# Patient Record
Sex: Male | Born: 1980 | Race: White | Hispanic: Yes | Marital: Single
Health system: Southern US, Community
[De-identification: ages and names within clinical notes are randomized; demographics above are authoritative.]

## PROBLEM LIST (undated history)

## (undated) DIAGNOSIS — N289 Disorder of kidney and ureter, unspecified: Secondary | ICD-10-CM

## (undated) HISTORY — DX: Disorder of kidney and ureter, unspecified: N28.9

---

## 2005-06-06 ENCOUNTER — Emergency Department (HOSPITAL_COMMUNITY): Admission: EM | Admit: 2005-06-06 | Discharge: 2005-06-06 | Payer: Self-pay | Admitting: Emergency Medicine

## 2005-06-07 ENCOUNTER — Emergency Department (HOSPITAL_COMMUNITY): Admission: EM | Admit: 2005-06-07 | Discharge: 2005-06-07 | Payer: Self-pay | Admitting: Emergency Medicine

## 2006-08-20 ENCOUNTER — Emergency Department (HOSPITAL_COMMUNITY): Admission: EM | Admit: 2006-08-20 | Discharge: 2006-08-20 | Payer: Self-pay | Admitting: Emergency Medicine

## 2006-09-03 ENCOUNTER — Emergency Department (HOSPITAL_COMMUNITY): Admission: EM | Admit: 2006-09-03 | Discharge: 2006-09-03 | Payer: Self-pay | Admitting: Emergency Medicine

## 2016-02-10 ENCOUNTER — Emergency Department (HOSPITAL_COMMUNITY)
Admission: EM | Admit: 2016-02-10 | Discharge: 2016-02-10 | Disposition: A | Discharge status: Home / Self Care | Payer: Self-pay | Attending: Emergency Medicine | Admitting: Emergency Medicine

## 2016-02-10 ENCOUNTER — Encounter (HOSPITAL_COMMUNITY): Payer: Self-pay | Admitting: Emergency Medicine

## 2016-02-10 ENCOUNTER — Emergency Department (HOSPITAL_COMMUNITY): Payer: Self-pay

## 2016-02-10 DIAGNOSIS — R1032 Left lower quadrant pain: Secondary | ICD-10-CM

## 2016-02-10 DIAGNOSIS — F17200 Nicotine dependence, unspecified, uncomplicated: Secondary | ICD-10-CM | POA: Insufficient documentation

## 2016-02-10 DIAGNOSIS — Z791 Long term (current) use of non-steroidal anti-inflammatories (NSAID): Secondary | ICD-10-CM | POA: Insufficient documentation

## 2016-02-10 DIAGNOSIS — R739 Hyperglycemia, unspecified: Secondary | ICD-10-CM | POA: Insufficient documentation

## 2016-02-10 LAB — CBC WITH DIFFERENTIAL/PLATELET
Basophils Absolute: 0 10*3/uL (ref 0.0–0.1)
Basophils Relative: 0 %
Eosinophils Absolute: 0 10*3/uL (ref 0.0–0.7)
Eosinophils Relative: 0 %
HEMATOCRIT: 46.8 % (ref 39.0–52.0)
Hemoglobin: 15.8 g/dL (ref 13.0–17.0)
LYMPHS PCT: 14 %
Lymphs Abs: 1.2 10*3/uL (ref 0.7–4.0)
MCH: 30.3 pg (ref 26.0–34.0)
MCHC: 33.8 g/dL (ref 30.0–36.0)
MCV: 89.7 fL (ref 78.0–100.0)
MONO ABS: 0.3 10*3/uL (ref 0.1–1.0)
MONOS PCT: 4 %
NEUTROS ABS: 6.9 10*3/uL (ref 1.7–7.7)
Neutrophils Relative %: 82 %
Platelets: 194 10*3/uL (ref 150–400)
RBC: 5.22 MIL/uL (ref 4.22–5.81)
RDW: 12.6 % (ref 11.5–15.5)
WBC: 8.4 10*3/uL (ref 4.0–10.5)

## 2016-02-10 LAB — COMPREHENSIVE METABOLIC PANEL
ALT: 61 U/L (ref 17–63)
ANION GAP: 8 (ref 5–15)
AST: 33 U/L (ref 15–41)
Albumin: 3.9 g/dL (ref 3.5–5.0)
Alkaline Phosphatase: 91 U/L (ref 38–126)
BILIRUBIN TOTAL: 0.5 mg/dL (ref 0.3–1.2)
BUN: 15 mg/dL (ref 6–20)
CO2: 28 mmol/L (ref 22–32)
Calcium: 9 mg/dL (ref 8.9–10.3)
Chloride: 99 mmol/L — ABNORMAL LOW (ref 101–111)
Creatinine, Ser: 0.87 mg/dL (ref 0.61–1.24)
GFR calc Af Amer: >60 mL/min (ref >60)
Glucose, Bld: 159 mg/dL — ABNORMAL HIGH (ref 65–99)
POTASSIUM: 3.4 mmol/L — AB (ref 3.5–5.1)
Sodium: 135 mmol/L (ref 135–145)
TOTAL PROTEIN: 7.1 g/dL (ref 6.5–8.1)

## 2016-02-10 LAB — URINALYSIS, ROUTINE W REFLEX MICROSCOPIC
BILIRUBIN URINE: NEGATIVE
Glucose, UA: 50 mg/dL — AB
Hgb urine dipstick: NEGATIVE
KETONES UR: NEGATIVE mg/dL
LEUKOCYTES UA: NEGATIVE
NITRITE: NEGATIVE
PROTEIN: NEGATIVE mg/dL
Specific Gravity, Urine: 1.018 (ref 1.005–1.030)
pH: 8 (ref 5.0–8.0)

## 2016-02-10 LAB — LIPASE, BLOOD: LIPASE: 37 U/L (ref 11–51)

## 2016-02-10 MED ORDER — IOPAMIDOL (ISOVUE-300) INJECTION 61%
INTRAVENOUS | Status: AC
  Filled 2016-02-10: qty 30

## 2016-02-10 MED ORDER — MORPHINE SULFATE (PF) 4 MG/ML IV SOLN
4.0000 mg | Freq: Once | INTRAVENOUS | Status: AC
  Administered 2016-02-10: 4 mg via INTRAVENOUS
  Filled 2016-02-10: qty 1

## 2016-02-10 MED ORDER — ONDANSETRON HCL 4 MG/2ML IJ SOLN
4.0000 mg | Freq: Once | INTRAMUSCULAR | Status: AC
  Administered 2016-02-10: 4 mg via INTRAVENOUS
  Filled 2016-02-10: qty 2

## 2016-02-10 MED ORDER — OXYCODONE-ACETAMINOPHEN 5-325 MG PO TABS: 1.0000 | ORAL_TABLET | ORAL | 0 refills | Status: DC | PRN

## 2016-02-10 MED ORDER — SODIUM CHLORIDE 0.9 % IV BOLUS (SEPSIS)
1000.0000 mL | Freq: Once | INTRAVENOUS | Status: AC
  Administered 2016-02-10: 1000 mL via INTRAVENOUS

## 2016-02-10 MED ORDER — IOPAMIDOL (ISOVUE-300) INJECTION 61%
100.0000 mL | Freq: Once | INTRAVENOUS | Status: AC | PRN
  Administered 2016-02-10: 100 mL via INTRAVENOUS

## 2016-02-10 MED ORDER — PROMETHAZINE HCL 25 MG PO TABS: 25.0000 mg | ORAL_TABLET | Freq: Four times a day (QID) | ORAL | 0 refills | Status: DC | PRN

## 2016-02-10 NOTE — ED Triage Notes (Signed)
Right sided flank pain started yesterday. No urinary symptoms. No N/V. 10/10 pain. No fevers noted

## 2016-02-10 NOTE — ED Provider Notes (Signed)
[facility] Provider Note   CSN: [id number] Arrival date & time: 02/10/16  1141     History   Chief Complaint Chief Complaint  Patient presents with  . Abdominal Pain    HPI Jonathan Cobb is a 36 y.o. male.  Left lower quadrant pain for 24 hours. He is eating. No vomiting or diarrhea. No fever, sweats, chills, dysuria, hematuria, bloody stool.  No previous abdominal surgery. This pain is not typical for the patient. Severity is moderate. Nothing makes symptoms better or worse.      History reviewed. No pertinent past medical history.  There are no active problems to display for this patient.   History reviewed. No pertinent surgical history.     Home Medications    Prior to Admission medications   Medication Sig Start Date End Date Taking? Authorizing Provider  ibuprofen (ADVIL,MOTRIN) 200 MG tablet Take 400 mg by mouth every 6 (six) hours as needed for moderate pain.   Yes Historical Provider, MD  oxyCODONE-acetaminophen (PERCOCET) 5-325 MG tablet Take 1-2 tablets by mouth every 4 (four) hours as needed. 02/10/16   Donnetta HutchingBrian Willow Reczek, MD  promethazine (PHENERGAN) 25 MG tablet Take 1 tablet (25 mg total) by mouth every 6 (six) hours as needed. 02/10/16   Donnetta HutchingBrian Jossalin Chervenak, MD    Family History History reviewed. No pertinent family history.  Social History Social History  Substance Use Topics  . Smoking status: Current Some Day Smoker  . Smokeless tobacco: Never Used  . Alcohol use 3.0 oz/week    5 Standard drinks or equivalent per week     Allergies   Patient has no allergy information on record.   Review of Systems Review of Systems  All other systems reviewed and are negative.    Physical Exam Updated Vital Signs BP (!) 150/102   Pulse 74   Temp 98 F (36.7 C) (Oral)   Resp 18   Ht 5\' 5"  (1.651 m)   Wt 233 lb (105.7 kg)   SpO2 96%   BMI 38.77 kg/m   Physical Exam  Constitutional: He is oriented to person, place, and time. He appears well-developed  and well-nourished.  HENT:  Head: Normocephalic and atraumatic.  Eyes: Conjunctivae are normal.  Neck: Neck supple.  Cardiovascular: Normal rate and regular rhythm.   Pulmonary/Chest: Effort normal and breath sounds normal.  Abdominal:  Min LLQ tenderness  Musculoskeletal: Normal range of motion.  Neurological: He is alert and oriented to person, place, and time.  Skin: Skin is warm and dry.  Psychiatric: He has a normal mood and affect. His behavior is normal.  Nursing note and vitals reviewed.    ED Treatments / Results  Labs (all labs ordered are listed, but only abnormal results are displayed) Labs Reviewed  COMPREHENSIVE METABOLIC PANEL - Abnormal; Notable for the following:       Result Value   Potassium 3.4 (*)    Chloride 99 (*)    Glucose, Bld 159 (*)    All other components within normal limits  URINALYSIS, ROUTINE W REFLEX MICROSCOPIC - Abnormal; Notable for the following:    APPearance CLOUDY (*)    Glucose, UA 50 (*)    All other components within normal limits  CBC WITH DIFFERENTIAL/PLATELET  LIPASE, BLOOD    EKG  EKG Interpretation None       Radiology Ct Abdomen Pelvis W Contrast  Result Date: 02/10/2016 CLINICAL DATA:  Right-sided flank pain for 1 year. EXAM: CT ABDOMEN AND PELVIS WITH CONTRAST  TECHNIQUE: Multidetector CT imaging of the abdomen and pelvis was performed using the standard protocol following bolus administration of intravenous contrast. CONTRAST:  ISOVUE-300 IOPAMIDOL (ISOVUE-300) INJECTION 61% COMPARISON:  None. FINDINGS: Lower chest: Normal. Hepatobiliary: No focal liver abnormality is seen. No gallstones, gallbladder wall thickening, or biliary dilatation. Pancreas: Unremarkable. No pancreatic ductal dilatation or surrounding inflammatory changes. Spleen: Normal in size without focal abnormality. Adrenals/Urinary Tract: Adrenal glands are unremarkable. Kidneys are normal, without renal calculi, focal lesion, or hydronephrosis.  Bladder is unremarkable. Stomach/Bowel: Stomach is within normal limits. Appendix appears normal. No evidence of bowel wall thickening, distention, or inflammatory changes. Vascular/Lymphatic: No significant vascular findings are present. No enlarged abdominal or pelvic lymph nodes. Reproductive: Prostate is unremarkable. Other: No abdominal wall hernia or abnormality. No abdominopelvic ascites. Musculoskeletal: No acute or significant osseous findings. IMPRESSION: Benign-appearing abdomen and pelvis. Electronically Signed   By: Francene Boyers M.D.   On: 02/10/2016 14:58    Procedures Procedures (including critical care time)  Medications Ordered in ED Medications  iopamidol (ISOVUE-300) 61 % injection (not administered)  sodium chloride 0.9 % bolus 1,000 mL (0 mLs Intravenous Stopped 02/10/16 1508)  ondansetron (ZOFRAN) injection 4 mg (4 mg Intravenous Given 02/10/16 1329)  morphine 4 MG/ML injection 4 mg (4 mg Intravenous Given 02/10/16 1329)  morphine 4 MG/ML injection 4 mg (4 mg Intravenous Given 02/10/16 1507)  iopamidol (ISOVUE-300) 61 % injection 100 mL (100 mLs Intravenous Contrast Given 02/10/16 1438)     Initial Impression / Assessment and Plan / ED Course  I have reviewed the triage vital signs and the nursing notes.  Pertinent labs & imaging results that were available during my care of the patient were reviewed by me and considered in my medical decision making (see chart for details).  Clinical Course     No acute abdomen. I was concerned about possible diverticulitis versus kidney stone. CT abdomen pelvis shows no acute findings. His glucose is minimally elevated. Discharge medications Percocet and Phenergan 25 mg.  Discussed with patient and his wife. Will return to force  Final Clinical Impressions(s) / ED Diagnoses   Final diagnoses:  LLQ abdominal pain  Hyperglycemia    New Prescriptions New Prescriptions   OXYCODONE-ACETAMINOPHEN (PERCOCET) 5-325 MG TABLET    Take 1-2  tablets by mouth every 4 (four) hours as needed.   PROMETHAZINE (PHENERGAN) 25 MG TABLET    Take 1 tablet (25 mg total) by mouth every 6 (six) hours as needed.     Donnetta Hutching, MD 02/10/16 1525

## 2016-02-10 NOTE — Discharge Instructions (Signed)
CT scan of your abdomen did not show any serious medical issues. Your glucose or sugar was slightly elevated today. Medication for pain and nausea. Suggest clear liquids tonight. Return for fever, chills, trouble with urination, any concerns

## 2016-03-15 ENCOUNTER — Encounter (HOSPITAL_COMMUNITY): Payer: Self-pay | Admitting: Emergency Medicine

## 2016-03-15 ENCOUNTER — Emergency Department (HOSPITAL_COMMUNITY)
Admission: EM | Admit: 2016-03-15 | Discharge: 2016-03-15 | Disposition: A | Discharge status: Home / Self Care | Payer: Self-pay | Attending: Emergency Medicine | Admitting: Emergency Medicine

## 2016-03-15 ENCOUNTER — Emergency Department (HOSPITAL_COMMUNITY): Payer: Self-pay

## 2016-03-15 DIAGNOSIS — R0781 Pleurodynia: Secondary | ICD-10-CM

## 2016-03-15 DIAGNOSIS — F17200 Nicotine dependence, unspecified, uncomplicated: Secondary | ICD-10-CM | POA: Insufficient documentation

## 2016-03-15 DIAGNOSIS — J40 Bronchitis, not specified as acute or chronic: Secondary | ICD-10-CM | POA: Insufficient documentation

## 2016-03-15 LAB — COMPREHENSIVE METABOLIC PANEL
ALT: 62 U/L (ref 17–63)
AST: 31 U/L (ref 15–41)
Albumin: 4 g/dL (ref 3.5–5.0)
Alkaline Phosphatase: 83 U/L (ref 38–126)
Anion gap: 11 (ref 5–15)
BILIRUBIN TOTAL: 0.6 mg/dL (ref 0.3–1.2)
BUN: 14 mg/dL (ref 6–20)
CO2: 25 mmol/L (ref 22–32)
CREATININE: 0.71 mg/dL (ref 0.61–1.24)
Calcium: 9.3 mg/dL (ref 8.9–10.3)
Chloride: 102 mmol/L (ref 101–111)
Glucose, Bld: 107 mg/dL — ABNORMAL HIGH (ref 65–99)
POTASSIUM: 3.5 mmol/L (ref 3.5–5.1)
Sodium: 138 mmol/L (ref 135–145)
TOTAL PROTEIN: 7.2 g/dL (ref 6.5–8.1)

## 2016-03-15 LAB — URINALYSIS, ROUTINE W REFLEX MICROSCOPIC
Bilirubin Urine: NEGATIVE
Glucose, UA: NEGATIVE mg/dL
KETONES UR: NEGATIVE mg/dL
LEUKOCYTES UA: NEGATIVE
Nitrite: NEGATIVE
Specific Gravity, Urine: 1.02 (ref 1.005–1.030)
pH: 6 (ref 5.0–8.0)

## 2016-03-15 LAB — CBC WITH DIFFERENTIAL/PLATELET
BASOS ABS: 0 10*3/uL (ref 0.0–0.1)
Basophils Relative: 0 %
EOS ABS: 0.1 10*3/uL (ref 0.0–0.7)
EOS PCT: 1 %
HCT: 45.5 % (ref 39.0–52.0)
HEMOGLOBIN: 16 g/dL (ref 13.0–17.0)
LYMPHS ABS: 2.4 10*3/uL (ref 0.7–4.0)
Lymphocytes Relative: 26 %
MCH: 30.3 pg (ref 26.0–34.0)
MCHC: 35.2 g/dL (ref 30.0–36.0)
MCV: 86.2 fL (ref 78.0–100.0)
Monocytes Absolute: 0.6 10*3/uL (ref 0.1–1.0)
Monocytes Relative: 7 %
NEUTROS PCT: 66 %
Neutro Abs: 5.9 10*3/uL (ref 1.7–7.7)
PLATELETS: 192 10*3/uL (ref 150–400)
RBC: 5.28 MIL/uL (ref 4.22–5.81)
RDW: 12.3 % (ref 11.5–15.5)
WBC: 9 10*3/uL (ref 4.0–10.5)

## 2016-03-15 LAB — URINALYSIS, MICROSCOPIC (REFLEX)
BACTERIA UA: NONE SEEN
WBC, UA: NONE SEEN WBC/hpf (ref 0–5)

## 2016-03-15 MED ORDER — AZITHROMYCIN 250 MG PO TABS: ORAL_TABLET | ORAL | 0 refills | Status: DC

## 2016-03-15 MED ORDER — NAPROXEN 500 MG PO TABS: 500.0000 mg | ORAL_TABLET | Freq: Two times a day (BID) | ORAL | 0 refills | Status: DC

## 2016-03-15 MED ORDER — OXYCODONE-ACETAMINOPHEN 5-325 MG PO TABS
1.0000 | ORAL_TABLET | Freq: Once | ORAL | Status: AC
  Administered 2016-03-15: 1 via ORAL
  Filled 2016-03-15: qty 1

## 2016-03-15 NOTE — ED Triage Notes (Signed)
Patient complains of left flank pain that started yesterday. Patient denies N/V/D, or urinary symptoms.

## 2016-03-15 NOTE — ED Provider Notes (Signed)
[facility] Provider Note   CSN: [id number] Arrival date & time: 03/15/16  1219     History   Chief Complaint Chief Complaint  Patient presents with  . Flank Pain    HPI Jonathan Cobb is a 36 y.o. male.  HPI   Jonathan Cobb is a 36 y.o. male who presents to the Emergency Department complaining of continued left abdominal pain.  He states he was seen here last month for left abdominal pain.  Pain temporarily improved after taking prescription pain medication.  Pain became worse yesterday. He describes a pain to his lower left ribs.  Pain is reproduced with palpation.  Pain is not associated with food intake, fever, chills, shortness of breath, vomiting, or diarrhea.  He also denies known injury.      History reviewed. No pertinent past medical history.  There are no active problems to display for this patient.   History reviewed. No pertinent surgical history.     Home Medications    Prior to Admission medications   Medication Sig Start Date End Date Taking? Authorizing Provider  ibuprofen (ADVIL,MOTRIN) 200 MG tablet Take 400 mg by mouth every 6 (six) hours as needed for moderate pain.    Historical Provider, MD  oxyCODONE-acetaminophen (PERCOCET) 5-325 MG tablet Take 1-2 tablets by mouth every 4 (four) hours as needed. 02/10/16   Donnetta Hutching, MD  promethazine (PHENERGAN) 25 MG tablet Take 1 tablet (25 mg total) by mouth every 6 (six) hours as needed. 02/10/16   Donnetta Hutching, MD    Family History No family history on file.  Social History Social History  Substance Use Topics  . Smoking status: Current Some Day Smoker  . Smokeless tobacco: Never Used  . Alcohol use 3.0 oz/week    5 Standard drinks or equivalent per week     Allergies   Patient has no known allergies.   Review of Systems Review of Systems  Constitutional: Negative for activity change, appetite change and fever.  HENT: Negative for congestion and trouble swallowing.   Respiratory:  Negative for cough and shortness of breath.   Cardiovascular: Positive for chest pain (left lower rib pain).  Gastrointestinal: Negative for abdominal pain, diarrhea, nausea and vomiting.  Genitourinary: Negative for dysuria, flank pain, hematuria, penile swelling and scrotal swelling.  Musculoskeletal: Negative for back pain.  Skin: Negative for rash.  Neurological: Negative for dizziness, weakness, numbness and headaches.     Physical Exam Updated Vital Signs BP 153/99 (BP Location: Left Arm)   Pulse 86   Temp 97.8 F (36.6 C) (Oral)   Ht 5\' 4"  (1.626 m)   Wt 103.9 kg   SpO2 98%   BMI 39.31 kg/m   Physical Exam  Constitutional: He is oriented to person, place, and time. He appears well-developed and well-nourished. No distress.  HENT:  Head: Atraumatic.  Mouth/Throat: Oropharynx is clear and moist.  Neck: Normal range of motion. Neck supple. No JVD present.  Cardiovascular: Normal rate, regular rhythm, normal heart sounds and intact distal pulses.   No murmur heard. Pulmonary/Chest: Effort normal. He exhibits tenderness (focal tenderness of the left lower ribs,.  no crepitus no guarding on exam).  Abdominal: Soft. Normal appearance. He exhibits no distension and no mass. There is no tenderness. There is no rebound, no guarding and no CVA tenderness.  Musculoskeletal: Normal range of motion. He exhibits no edema or tenderness.  Neurological: He is oriented to person, place, and time. No cranial nerve deficit.  Skin: Skin is  warm. No rash noted.  Nursing note and vitals reviewed.    ED Treatments / Results  Labs (all labs ordered are listed, but only abnormal results are displayed) Labs Reviewed  URINALYSIS, ROUTINE W REFLEX MICROSCOPIC - Abnormal; Notable for the following:       Result Value   Hgb urine dipstick TRACE (*)    Protein, ur TRACE (*)    All other components within normal limits  URINALYSIS, MICROSCOPIC (REFLEX) - Abnormal; Notable for the following:     Squamous Epithelial / LPF 0-5 (*)    All other components within normal limits  COMPREHENSIVE METABOLIC PANEL  CBC WITH DIFFERENTIAL/PLATELET    EKG  EKG Interpretation None       Radiology Dg Ribs Unilateral W/chest Left  Result Date: 03/15/2016 CLINICAL DATA:  LEFT rib pain beginning this morning, no known injury, smoker EXAM: LEFT RIBS AND CHEST - 3+ VIEW COMPARISON:  None. FINDINGS: Upper normal heart size. Mediastinal contours and pulmonary vascularity normal. Bronchitic changes without infiltrate, pleural effusion or pneumothorax. Osseous mineralization normal. No rib fracture or bone destruction. IMPRESSION: Bronchitic changes. No acute LEFT rib abnormalities. Electronically Signed   By: Ulyses SouthwardMark  Boles M.D.   On: 03/15/2016 17:50    Procedures Procedures (including critical care time)  Medications Ordered in ED Medications  oxyCODONE-acetaminophen (PERCOCET/ROXICET) 5-325 MG per tablet 1 tablet (not administered)     Initial Impression / Assessment and Plan / ED Course  I have reviewed the triage vital signs and the nursing notes.  Pertinent labs & imaging results that were available during my care of the patient were reviewed by me and considered in my medical decision making (see chart for details).     Pt well appearing.  Vitals stable.  Doubt acute abdomen or PE.   Pt seen here last month for same.  Labs, XR are reassuring.  XR shows bronchitis. CT A/P from previous visit reviewed.    Pt also seen by Dr. Adriana Simasook and care plan discussed.   The patient appears reasonably screened and/or stabilized for discharge and I doubt any other medical condition or other [facility] requiring further screening, evaluation, or treatment in the ED at this time prior to discharge.   Final Clinical Impressions(s) / ED Diagnoses   Final diagnoses:  Rib pain on left side  Bronchitis    New Prescriptions New Prescriptions   No medications on file     Pauline Ausammy Shelbey Spindler, PA-C 03/17/16  1323    Donnetta HutchingBrian Cook, MD 03/20/16 1313

## 2016-03-15 NOTE — Discharge Instructions (Signed)
You can contact one of the providers listed to establish primary care if needed.

## 2018-05-19 ENCOUNTER — Emergency Department (HOSPITAL_COMMUNITY)
Admission: EM | Admit: 2018-05-19 | Discharge: 2018-05-19 | Disposition: A | Discharge status: Home / Self Care | Payer: No Typology Code available for payment source | Attending: Emergency Medicine | Admitting: Emergency Medicine

## 2018-05-19 ENCOUNTER — Emergency Department (HOSPITAL_COMMUNITY): Payer: No Typology Code available for payment source

## 2018-05-19 ENCOUNTER — Encounter (HOSPITAL_COMMUNITY): Payer: Self-pay | Admitting: Adult Health

## 2018-05-19 ENCOUNTER — Other Ambulatory Visit: Payer: Self-pay

## 2018-05-19 DIAGNOSIS — Z79899 Other long term (current) drug therapy: Secondary | ICD-10-CM | POA: Diagnosis not present

## 2018-05-19 DIAGNOSIS — F17200 Nicotine dependence, unspecified, uncomplicated: Secondary | ICD-10-CM | POA: Insufficient documentation

## 2018-05-19 DIAGNOSIS — Y929 Unspecified place or not applicable: Secondary | ICD-10-CM | POA: Insufficient documentation

## 2018-05-19 DIAGNOSIS — S80819A Abrasion, unspecified lower leg, initial encounter: Secondary | ICD-10-CM | POA: Insufficient documentation

## 2018-05-19 DIAGNOSIS — S0990XA Unspecified injury of head, initial encounter: Secondary | ICD-10-CM | POA: Diagnosis present

## 2018-05-19 DIAGNOSIS — Y939 Activity, unspecified: Secondary | ICD-10-CM | POA: Diagnosis not present

## 2018-05-19 DIAGNOSIS — T07XXXA Unspecified multiple injuries, initial encounter: Secondary | ICD-10-CM

## 2018-05-19 DIAGNOSIS — S7002XA Contusion of left hip, initial encounter: Secondary | ICD-10-CM | POA: Diagnosis not present

## 2018-05-19 DIAGNOSIS — S20212A Contusion of left front wall of thorax, initial encounter: Secondary | ICD-10-CM | POA: Diagnosis not present

## 2018-05-19 DIAGNOSIS — Y999 Unspecified external cause status: Secondary | ICD-10-CM | POA: Insufficient documentation

## 2018-05-19 DIAGNOSIS — Z23 Encounter for immunization: Secondary | ICD-10-CM | POA: Insufficient documentation

## 2018-05-19 DIAGNOSIS — R1032 Left lower quadrant pain: Secondary | ICD-10-CM | POA: Diagnosis not present

## 2018-05-19 LAB — ETHANOL: Alcohol, Ethyl (B): <10 mg/dL (ref <10)

## 2018-05-19 LAB — CBC
HCT: 48.9 % (ref 39.0–52.0)
Hemoglobin: 16.1 g/dL (ref 13.0–17.0)
MCH: 29 pg (ref 26.0–34.0)
MCHC: 32.9 g/dL (ref 30.0–36.0)
MCV: 88.1 fL (ref 80.0–100.0)
Platelets: 181 10*3/uL (ref 150–400)
RBC: 5.55 MIL/uL (ref 4.22–5.81)
RDW: 12.4 % (ref 11.5–15.5)
WBC: 8.3 10*3/uL (ref 4.0–10.5)
nRBC: 0 % (ref 0.0–0.2)

## 2018-05-19 LAB — PROTIME-INR
INR: 1 (ref 0.8–1.2)
Prothrombin Time: 12.8 seconds (ref 11.4–15.2)

## 2018-05-19 LAB — CDS SEROLOGY

## 2018-05-19 LAB — COMPREHENSIVE METABOLIC PANEL
ALT: 70 U/L — ABNORMAL HIGH (ref 0–44)
AST: 42 U/L — ABNORMAL HIGH (ref 15–41)
Albumin: 4 g/dL (ref 3.5–5.0)
Alkaline Phosphatase: 102 U/L (ref 38–126)
Anion gap: 10 (ref 5–15)
BUN: 20 mg/dL (ref 6–20)
CO2: 26 mmol/L (ref 22–32)
Calcium: 8.9 mg/dL (ref 8.9–10.3)
Chloride: 104 mmol/L (ref 98–111)
Creatinine, Ser: 0.87 mg/dL (ref 0.61–1.24)
GFR calc Af Amer: >60 mL/min (ref >60)
GFR calc non Af Amer: >60 mL/min (ref >60)
Glucose, Bld: 113 mg/dL — ABNORMAL HIGH (ref 70–99)
Potassium: 3.5 mmol/L (ref 3.5–5.1)
Sodium: 140 mmol/L (ref 135–145)
Total Bilirubin: 0.8 mg/dL (ref 0.3–1.2)
Total Protein: 7.4 g/dL (ref 6.5–8.1)

## 2018-05-19 LAB — URINALYSIS, ROUTINE W REFLEX MICROSCOPIC
Bacteria, UA: NONE SEEN
Bilirubin Urine: NEGATIVE
Glucose, UA: NEGATIVE mg/dL
Ketones, ur: NEGATIVE mg/dL
Leukocytes,Ua: NEGATIVE
Nitrite: NEGATIVE
Protein, ur: NEGATIVE mg/dL
RBC / HPF: >50 RBC/hpf — ABNORMAL HIGH (ref 0–5)
Specific Gravity, Urine: 1.045 — ABNORMAL HIGH (ref 1.005–1.030)
pH: 6 (ref 5.0–8.0)

## 2018-05-19 LAB — SAMPLE TO BLOOD BANK

## 2018-05-19 LAB — LACTIC ACID, PLASMA: Lactic Acid, Venous: 1.8 mmol/L (ref 0.5–1.9)

## 2018-05-19 MED ORDER — SODIUM CHLORIDE 0.9 % IV BOLUS
1000.0000 mL | Freq: Once | INTRAVENOUS | Status: AC
  Administered 2018-05-19: 1000 mL via INTRAVENOUS

## 2018-05-19 MED ORDER — IBUPROFEN 800 MG PO TABS: 800.0000 mg | ORAL_TABLET | Freq: Three times a day (TID) | ORAL | 0 refills | Status: DC | PRN

## 2018-05-19 MED ORDER — MORPHINE SULFATE (PF) 4 MG/ML IV SOLN
4.0000 mg | Freq: Once | INTRAVENOUS | Status: AC
  Administered 2018-05-19: 4 mg via INTRAVENOUS
  Filled 2018-05-19: qty 1

## 2018-05-19 MED ORDER — OXYCODONE-ACETAMINOPHEN 5-325 MG PO TABS: 1.0000 | ORAL_TABLET | Freq: Four times a day (QID) | ORAL | 0 refills | Status: AC | PRN

## 2018-05-19 MED ORDER — IOHEXOL 300 MG/ML  SOLN
100.0000 mL | Freq: Once | INTRAMUSCULAR | Status: AC | PRN
  Administered 2018-05-19: 100 mL via INTRAVENOUS

## 2018-05-19 MED ORDER — TETANUS-DIPHTH-ACELL PERTUSSIS 5-2.5-18.5 LF-MCG/0.5 IM SUSP
0.5000 mL | Freq: Once | INTRAMUSCULAR | Status: AC
  Administered 2018-05-19: 0.5 mL via INTRAMUSCULAR
  Filled 2018-05-19: qty 0.5

## 2018-05-19 MED ORDER — OXYCODONE-ACETAMINOPHEN 5-325 MG PO TABS
1.0000 | ORAL_TABLET | Freq: Once | ORAL | Status: AC
  Administered 2018-05-19: 1 via ORAL
  Filled 2018-05-19: qty 1

## 2018-05-19 MED ORDER — ONDANSETRON HCL 4 MG/2ML IJ SOLN
4.0000 mg | Freq: Once | INTRAMUSCULAR | Status: AC
  Administered 2018-05-19: 4 mg via INTRAVENOUS
  Filled 2018-05-19: qty 2
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
## 2018-05-19 NOTE — ED Notes (Signed)
PT GIVEN URINAL

## 2018-05-19 NOTE — ED Notes (Signed)
Pt given water, sitting up
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
## 2018-05-19 NOTE — ED Notes (Signed)
Pt transported to radiology.

## 2018-05-19 NOTE — ED Notes (Signed)
Pt ambulated in room, stand by assistance, pt tolerated well, steady gait, states pain in left hip and right knee
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
## 2018-05-19 NOTE — ED Notes (Signed)
X-ray at bedside

## 2018-05-19 NOTE — ED Notes (Signed)
Pt arrived back from radiology. Blood noted on pt's gown. Pt bleeding from lac to LT hip. Pressure applied by this RN. EDP notified and is at bedside. Pressure dressing applied.

## 2018-05-31 ENCOUNTER — Telehealth: Payer: Self-pay | Admitting: Orthopedic Surgery
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
## 2018-06-01 NOTE — Telephone Encounter (Signed)
Yes this is a wait till June situation

## 2018-06-01 NOTE — Telephone Encounter (Signed)
Called Jonathan Cobb this morning and relayed the message. He stated they may look somewhere else.

## 2018-06-14 ENCOUNTER — Other Ambulatory Visit: Payer: Self-pay

## 2018-06-14 ENCOUNTER — Ambulatory Visit: Payer: Self-pay | Admitting: Orthopaedic Surgery

## 2018-06-21 ENCOUNTER — Encounter: Payer: Self-pay | Admitting: Orthopaedic Surgery

## 2018-06-21 ENCOUNTER — Ambulatory Visit (INDEPENDENT_AMBULATORY_CARE_PROVIDER_SITE_OTHER): Payer: Self-pay | Admitting: Orthopaedic Surgery

## 2018-06-21 ENCOUNTER — Ambulatory Visit: Payer: Self-pay

## 2018-06-21 ENCOUNTER — Ambulatory Visit (INDEPENDENT_AMBULATORY_CARE_PROVIDER_SITE_OTHER): Payer: Self-pay

## 2018-06-21 ENCOUNTER — Other Ambulatory Visit: Payer: Self-pay

## 2018-06-21 VITALS — BP 140/92 | HR 70 | Ht 67.0 in | Wt 243.0 lb

## 2018-06-21 DIAGNOSIS — M25532 Pain in left wrist: Secondary | ICD-10-CM

## 2018-06-21 DIAGNOSIS — M25552 Pain in left hip: Secondary | ICD-10-CM

## 2018-06-21 DIAGNOSIS — S7002XA Contusion of left hip, initial encounter: Secondary | ICD-10-CM | POA: Insufficient documentation

## 2018-06-21 MED ORDER — IBUPROFEN 800 MG PO TABS: 800.0000 mg | ORAL_TABLET | Freq: Two times a day (BID) | ORAL | 1 refills | Status: AC | PRN
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
## 2018-07-11 ENCOUNTER — Ambulatory Visit (HOSPITAL_COMMUNITY): Payer: No Typology Code available for payment source | Admitting: Physical Therapy

## 2018-07-11 ENCOUNTER — Other Ambulatory Visit: Payer: Self-pay

## 2018-07-11 ENCOUNTER — Telehealth (HOSPITAL_COMMUNITY): Payer: Self-pay

## 2018-07-11 ENCOUNTER — Ambulatory Visit (HOSPITAL_COMMUNITY): Payer: No Typology Code available for payment source | Attending: Orthopaedic Surgery

## 2018-07-11 ENCOUNTER — Encounter (HOSPITAL_COMMUNITY): Payer: Self-pay | Admitting: Physical Therapy

## 2018-07-11 ENCOUNTER — Encounter (HOSPITAL_COMMUNITY): Payer: Self-pay

## 2018-07-11 DIAGNOSIS — R29898 Other symptoms and signs involving the musculoskeletal system: Secondary | ICD-10-CM | POA: Diagnosis present

## 2018-07-11 DIAGNOSIS — M6281 Muscle weakness (generalized): Secondary | ICD-10-CM

## 2018-07-11 DIAGNOSIS — M25532 Pain in left wrist: Secondary | ICD-10-CM | POA: Diagnosis present

## 2018-07-11 DIAGNOSIS — M79652 Pain in left thigh: Secondary | ICD-10-CM
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
## 2018-07-11 NOTE — Patient Instructions (Addendum)
  Access Code: [facility]  URL: [email or web address]  Date: 07/11/2018  Prepared by: Verne Carrow   Exercises Supine Bridge - 10 reps - 2 sets - 2 hold - 1x daily - 7x weekly Seated Hip Adduction Isometrics with Ball - 10 reps - 2 sets - 10 seconds hold - 1x daily - 7x weekly Hooklying Isometric Hip Flexion - 10 reps - 2 sets - 10 hold - 1x daily - 7x weekly
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
## 2018-07-11 NOTE — Patient Instructions (Signed)
Hold: 20-30 seconds. Complete: 2   Hold: 20-30 seconds. Complete: 2       Complete: 10-15X      Complete: 10-15X

## 2018-07-19 ENCOUNTER — Other Ambulatory Visit: Payer: Self-pay

## 2018-07-19 ENCOUNTER — Ambulatory Visit: Payer: Self-pay | Admitting: Orthopaedic Surgery

## 2018-08-16 ENCOUNTER — Telehealth (HOSPITAL_COMMUNITY): Payer: Self-pay | Admitting: Physical Therapy

## 2018-08-16 NOTE — Telephone Encounter (Signed)
This pt's attorney had faxed over a medical records release form requesting medical records from our office The paper work for medical records has been faxed over to the him dept.

## 2018-11-08 IMAGING — CT CT ABD-PELV W/ CM
2 of 4 series · 17 of 46 positions shown, 19 images · IV contrast (Isovue)
Comparison: None.

CLINICAL DATA: Right-sided flank pain for 1 year.

EXAM:
CT ABDOMEN AND PELVIS WITH CONTRAST
TECHNIQUE: Multidetector CT imaging of the abdomen and pelvis was performed
using the standard protocol following bolus administration of
intravenous contrast.
CONTRAST:  100mL [id number] IOPAMIDOL ([id number]) INJECTION 61%

[Series 2: axial st · axial · 0.79mm/px · z∈[+1023,+1478]mm · 14 of 101 slices shown, 16 images]
[im 5/101  soft-tissue]
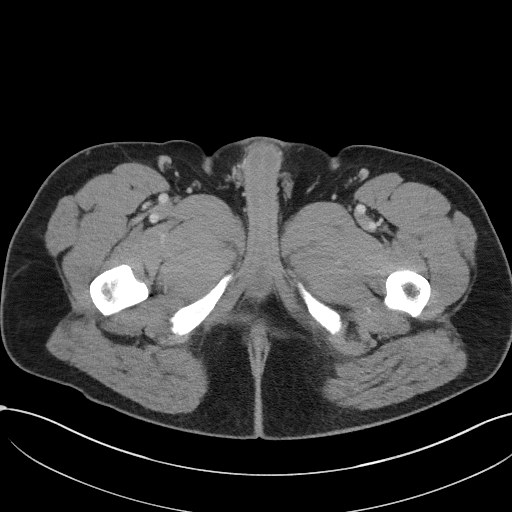
[im 5/101  bone]
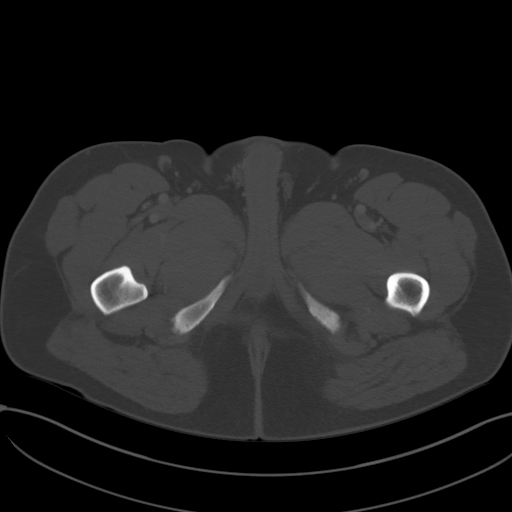
[im 15/101  soft-tissue]
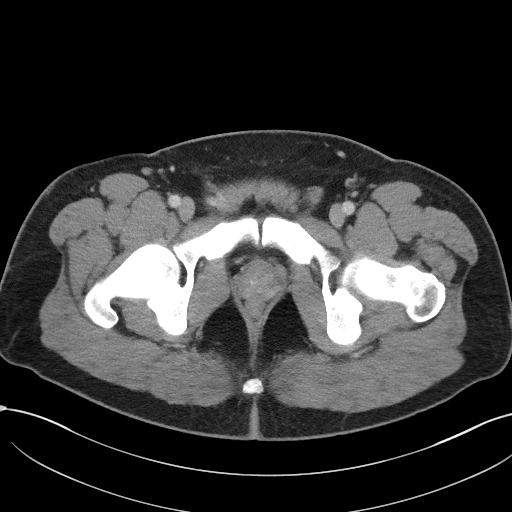
[im 20/101  soft-tissue]
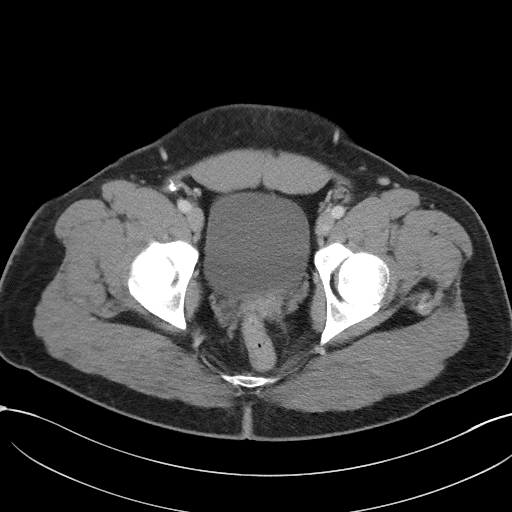
[im 29/101  soft-tissue]
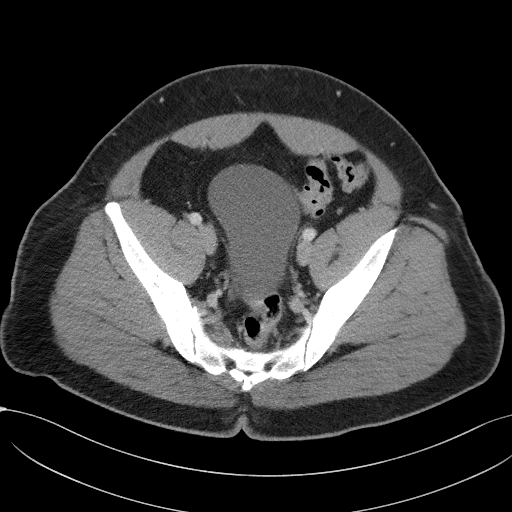
[im 34/101  soft-tissue]
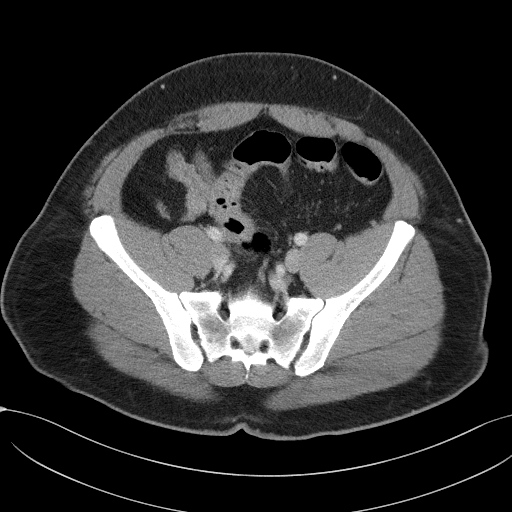
[im 39/101  soft-tissue]
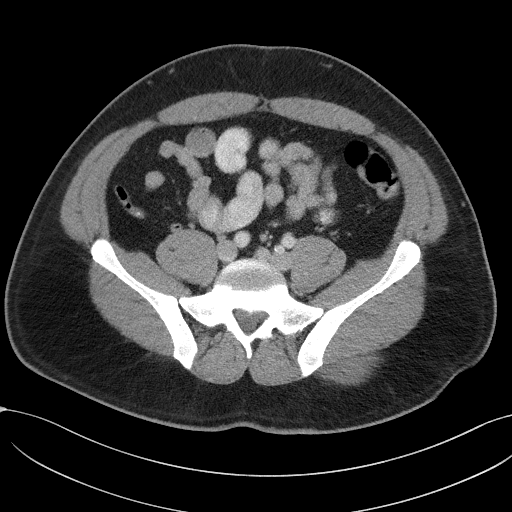
[im 48/101  soft-tissue]
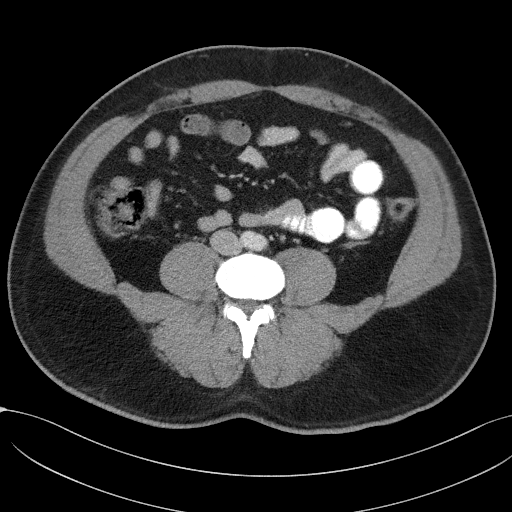
[im 53/101  soft-tissue]
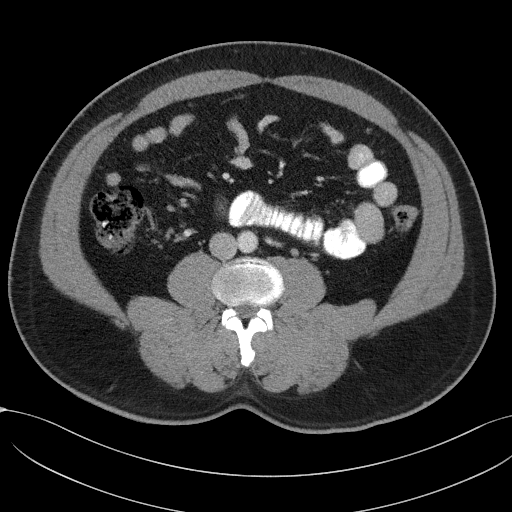
[im 62/101  soft-tissue]
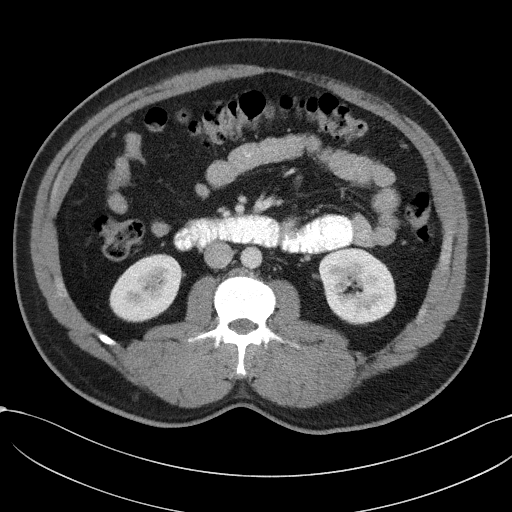
[im 62/101  bone]
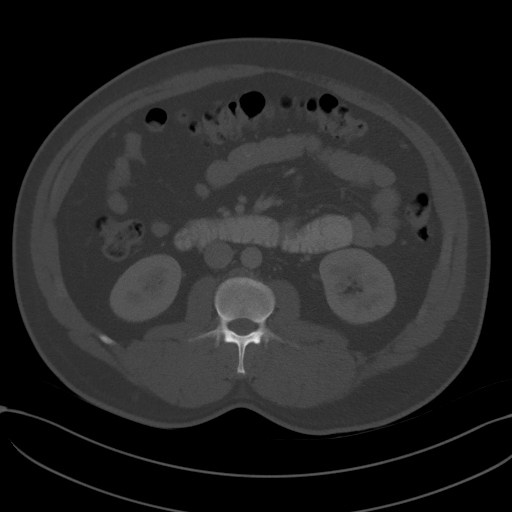
[im 67/101  soft-tissue]
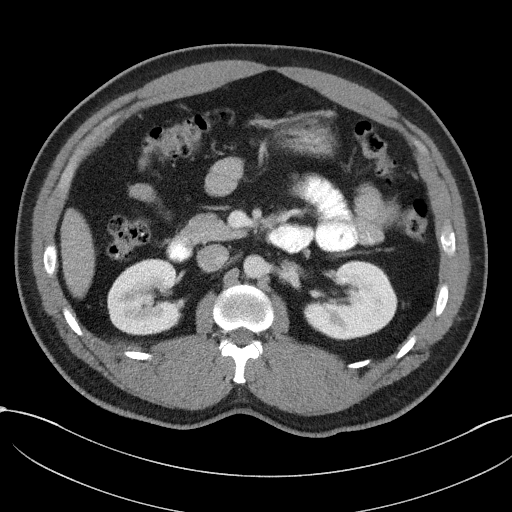
[im 77/101  soft-tissue]
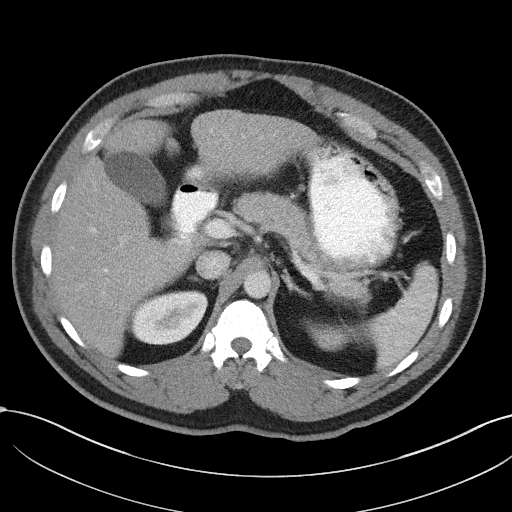
[im 81/101  soft-tissue]
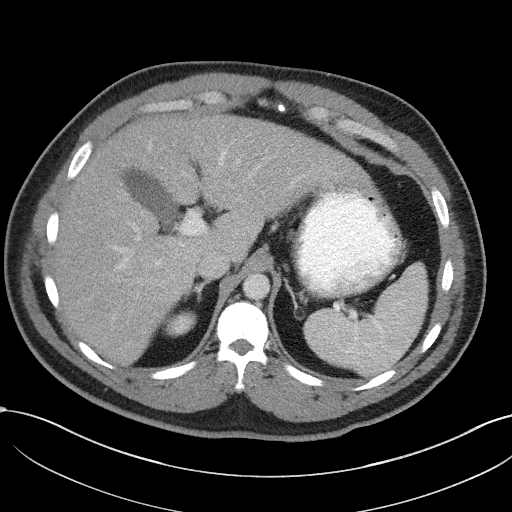
[im 86/101  soft-tissue]
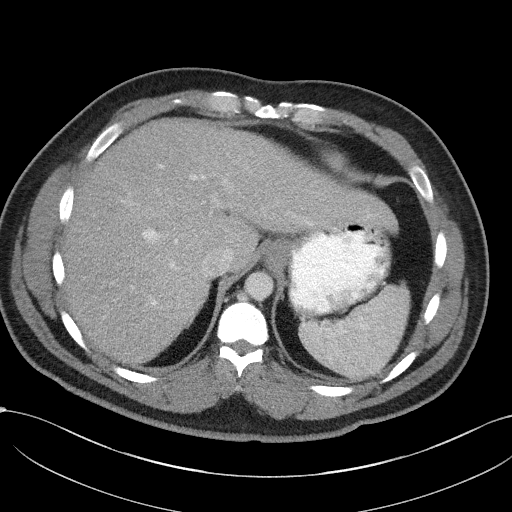
[im 96/101  soft-tissue]
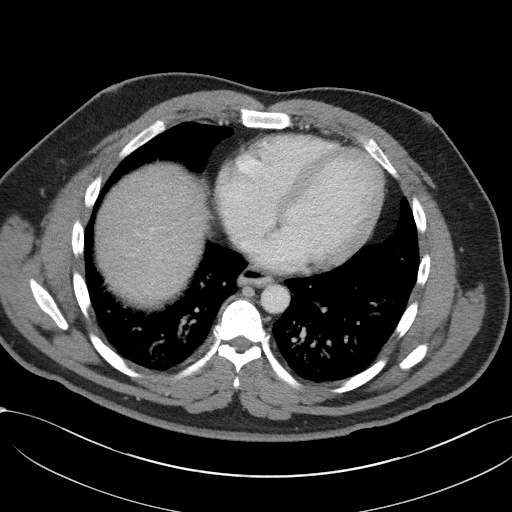

[Series 4: coronal st · coronal · 0.89mm/px · 3 of 104 slices shown]
[im 35/104  soft-tissue]
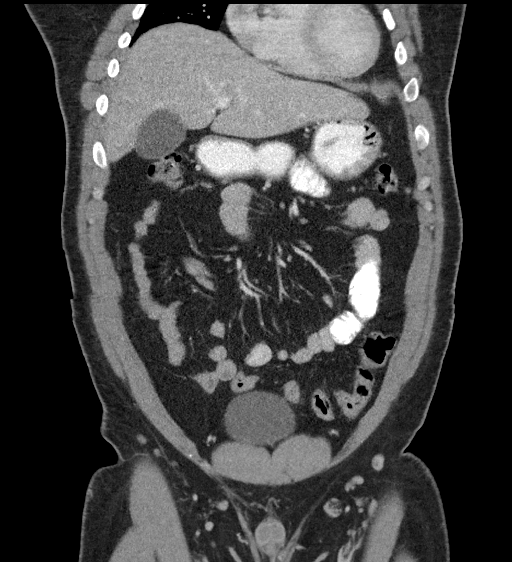
[im 46/104  soft-tissue]
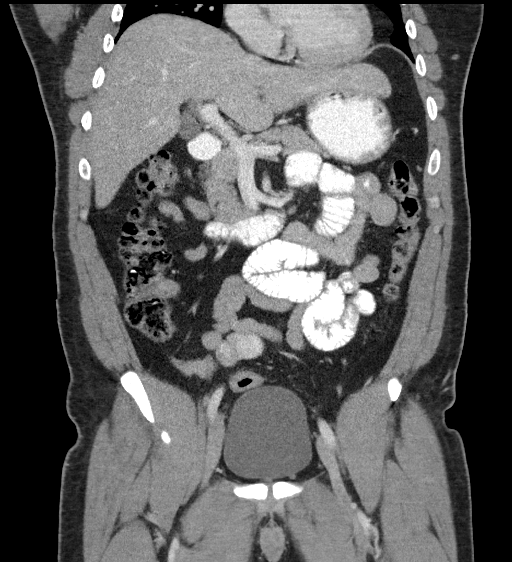
[im 58/104  soft-tissue]
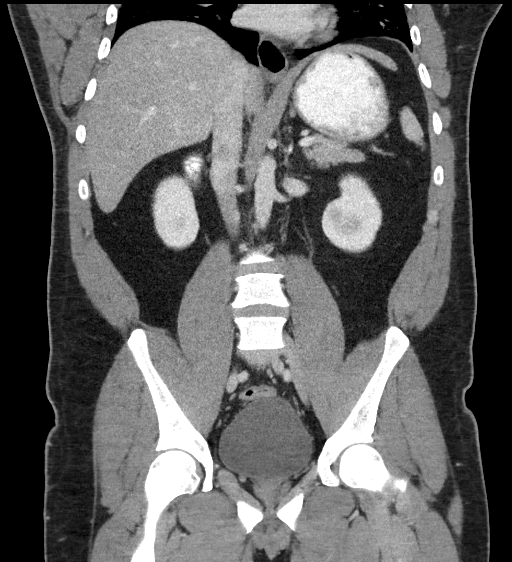

[17 of 46 positions shown; findings below may reference images not displayed]

FINDINGS: Lower chest: Normal.

Hepatobiliary: No focal liver abnormality is seen. No gallstones,
gallbladder wall thickening, or biliary dilatation.

Pancreas: Unremarkable. No pancreatic ductal dilatation or
surrounding inflammatory changes.

Spleen: Normal in size without focal abnormality.

Adrenals/Urinary Tract: Adrenal glands are unremarkable. Kidneys are
normal, without renal calculi, focal lesion, or hydronephrosis.
Bladder is unremarkable.

Stomach/Bowel: Stomach is within normal limits. Appendix appears
normal. No evidence of bowel wall thickening, distention, or
inflammatory changes.

Vascular/Lymphatic: No significant vascular findings are present. No
enlarged abdominal or pelvic lymph nodes.

Reproductive: Prostate is unremarkable.

Other: No abdominal wall hernia or abnormality. No abdominopelvic
ascites.

Musculoskeletal: No acute or significant osseous findings.
IMPRESSION: Benign-appearing abdomen and pelvis.

## 2018-12-24 ENCOUNTER — Telehealth: Payer: Self-pay | Admitting: Orthopaedic Surgery

## 2018-12-24 NOTE — Telephone Encounter (Signed)
Received vm from [facility] w/ Atty Zeller checking if we received their request. IC [phone], lm with receptionist informing we do not have request. I left both our fax numbers for request to be sent
